# Patient Record
Sex: Female | Born: 1975 | Race: White | Hispanic: No | Marital: Married | Smoking: Former smoker
Health system: Southern US, Community
[De-identification: ages and names within clinical notes are randomized; demographics above are authoritative.]

## PROBLEM LIST (undated history)

## (undated) HISTORY — PX: WISDOM TOOTH EXTRACTION: SHX21

## (undated) HISTORY — PX: ADENOIDECTOMY: SUR15

---

## 2012-10-15 ENCOUNTER — Encounter: Payer: Self-pay | Admitting: Emergency Medicine

## 2012-10-15 ENCOUNTER — Emergency Department
Admission: EM | Admit: 2012-10-15 | Discharge: 2012-10-15 | Disposition: A | Discharge status: Home / Self Care | Payer: BC Managed Care – PPO | Source: Home / Self Care | Attending: Emergency Medicine | Admitting: Emergency Medicine

## 2012-10-15 DIAGNOSIS — H109 Unspecified conjunctivitis: Secondary | ICD-10-CM

## 2012-10-15 DIAGNOSIS — J322 Chronic ethmoidal sinusitis: Secondary | ICD-10-CM

## 2012-10-15 MED ORDER — TOBRAMYCIN 0.3 % OP SOLN: 1.0000 [drp] | OPHTHALMIC | Status: DC

## 2012-10-15 MED ORDER — CEPHALEXIN 500 MG PO CAPS: 500.0000 mg | ORAL_CAPSULE | Freq: Three times a day (TID) | ORAL | Status: DC

## 2012-10-15 NOTE — ED Provider Notes (Signed)
CSN: [id number]     Arrival date & time 10/15/12  4401 History   First MD Initiated Contact with Patient 10/15/12 1001     Chief Complaint  Patient presents with  . Sinus Problem  . Eye Pain    Patient is a 37 y.o. female presenting with eye pain. The history is provided by the patient.  Eye Pain  SINUSITIS  Onset: 7 days Facial/sinus pressure with discolored nasal mucus.    Severity: moderate Tried OTC meds without significant relief. Then, 2 days ago onset both eyes feel swollen irritated with yellow drainage, right worse than left.--She has multiple drug allergies, but does use tobramycin for conjunctivitis in the past without problems. For sinus infections in the past, she has taken cephalexin without any problems.  Symptoms:  + Fever  + URI prodrome with nasal congestion + Minimal swollen neck glands + mild Sinus Headache + mild ear pressure  No Allergy symptoms No significant Sore Throat      No significant Cough No chest pain No shortness of breath  No wheezing  No Abdominal Pain No Nausea No Vomiting No diarrhea  No Myalgias No focal neurologic symptoms No syncope No Rash  No Urinary symptoms          History reviewed. No pertinent past medical history. History reviewed. No pertinent past surgical history. Family History  Problem Relation Age of Onset  . Cancer Mother   . Diabetes Father   . Hypertension Father   . Cancer Father    History  Substance Use Topics  . Smoking status: Never Smoker   . Smokeless tobacco: Not on file  . Alcohol Use: No   OB History   Grav Para Term Preterm Abortions TAB SAB Ect Mult Living                 Review of Systems  Eyes: Positive for pain.  All other systems reviewed and are negative.    Allergies  Amoxicillin; Biaxin; Erythromycin; Sulfa antibiotics; and Tetracyclines & related  Home Medications   Current Outpatient Rx  Name  Route  Sig  Dispense  Refill  . cephALEXin (KEFLEX) 500 MG  capsule   Oral   Take 1 capsule (500 mg total) by mouth 3 (three) times daily.   30 capsule   0   . tobramycin (TOBREX) 0.3 % ophthalmic solution   Both Eyes   Place 1 drop into both eyes every 4 (four) hours.   5 mL   0    BP 124/89  Pulse 78  Temp(Src) 98.1 F (36.7 C) (Oral)  Ht 5\' 9"  (1.753 m)  Wt 229 lb (103.874 kg)  BMI 33.8 kg/m2  SpO2 99% Physical Exam  Nursing note and vitals reviewed. Constitutional: She is oriented to person, place, and time. She appears well-developed and well-nourished. No distress.  HENT:  Head: Normocephalic and atraumatic.  Right Ear: Tympanic membrane, external ear and ear canal normal.  Left Ear: Tympanic membrane, external ear and ear canal normal.  Nose: Mucosal edema and rhinorrhea present. Right sinus exhibits maxillary sinus tenderness. Left sinus exhibits maxillary sinus tenderness.  Mouth/Throat: Oropharynx is clear and moist. No oral lesions. No oropharyngeal exudate.  Eyes: Right eye exhibits discharge (mild crusty discharge). Left eye exhibits no discharge. No scleral icterus.  Both conjunctiva are red and inflamed and injected, right greater than left. No foreign body seen. No abrasion seen.  Neck: Neck supple.  Cardiovascular: Normal rate, regular rhythm and normal heart sounds.  Pulmonary/Chest: Effort normal and breath sounds normal. She has no wheezes. She has no rales.  Lymphadenopathy:    She has no cervical adenopathy.  Neurological: She is alert and oriented to person, place, and time.  Skin: Skin is warm and dry.    ED Course  Procedures (including critical care time) Labs Review Labs Reviewed - No data to display Imaging Review No results found.  MDM   1. Conjunctivitis   2. Ethmoid sinusitis    Treatment options discussed. Tobramycin eyedrops. Discard current contact lenses, and advised not to restart contact lenses until completely better. Followup with ophthalmologist if eye symptoms persist . For  the sinusitis, cephalexin prescribed. ( she has taken cephalexin in the past without any problems.) Other symptomatic care discussed. Precautions discussed. Red flags discussed. Questions invited and answered. Patient voiced understanding and agreement.    Lajean Manes, MD 10/15/12 [phone]

## 2012-10-15 NOTE — ED Notes (Addendum)
Sinus pressure and pain x 1 week Rt eye, swollen, burns, painful, draining x 2 days

## 2013-03-26 ENCOUNTER — Emergency Department
Admission: EM | Admit: 2013-03-26 | Discharge: 2013-03-26 | Disposition: A | Discharge status: Home / Self Care | Payer: BC Managed Care – PPO | Source: Home / Self Care | Attending: Family Medicine | Admitting: Family Medicine

## 2013-03-26 ENCOUNTER — Encounter: Payer: Self-pay | Admitting: Emergency Medicine

## 2013-03-26 DIAGNOSIS — R509 Fever, unspecified: Secondary | ICD-10-CM

## 2013-03-26 DIAGNOSIS — J111 Influenza due to unidentified influenza virus with other respiratory manifestations: Secondary | ICD-10-CM

## 2013-03-26 DIAGNOSIS — R69 Illness, unspecified: Principal | ICD-10-CM

## 2013-03-26 DIAGNOSIS — R52 Pain, unspecified: Secondary | ICD-10-CM

## 2013-03-26 MED ORDER — BENZONATATE 200 MG PO CAPS: 200.0000 mg | ORAL_CAPSULE | Freq: Every day | ORAL | Status: DC

## 2013-03-26 MED ORDER — OSELTAMIVIR PHOSPHATE 75 MG PO CAPS: 75.0000 mg | ORAL_CAPSULE | Freq: Two times a day (BID) | ORAL | Status: DC

## 2013-03-26 NOTE — Discharge Instructions (Signed)
Take plain Mucinex (1200 mg guaifenesin) twice daily for cough and congestion.  May add Sudafed for sinus congestion.   Increase fluid intake, rest. °May use Afrin nasal spray (or generic oxymetazoline) twice daily for about 5 days.  Also recommend using saline nasal spray several times daily and saline nasal irrigation (AYR is a common brand) °Try warm salt water gargles for sore throat.  °Stop all antihistamines for now, and other non-prescription cough/cold preparations. °May take Ibuprofen 200mg, 4 tabs every 8 hours with food for fever, body aches, etc. °  °   °

## 2013-03-26 NOTE — ED Provider Notes (Signed)
CSN: [id number]     Arrival date & time 03/26/13  1050 History   First MD Initiated Contact with Patient 03/26/13 1112     Chief Complaint  Patient presents with  . Fever  . Generalized Body Aches      HPI Comments: 48 hours ago patient noticed swelling in her left neck, followed by right neck swelling and soreness yesterday, although she has no soreness when swallowing.  Yesterday she developed fatigue, myalgias, productive cough, but no sinus congestion.  The history is provided by the patient and the spouse.    History reviewed. No pertinent past medical history. Past Surgical History  Procedure Laterality Date  . Adenoidectomy     Family History  Problem Relation Age of Onset  . Cancer Mother   . Diabetes Father   . Hypertension Father   . Cancer Father    History  Substance Use Topics  . Smoking status: Never Smoker   . Smokeless tobacco: Never Used  . Alcohol Use: No   OB History   Grav Para Term Preterm Abortions TAB SAB Ect Mult Living                 Review of Systems + sore throat (when coughing) + cough + hoarseness No pleuritic pain No wheezing No nasal congestion No post-nasal drainage No sinus pain/pressure No itchy/red eyes No earache No hemoptysis No SOB + fever, + chills No nausea No vomiting No abdominal pain No diarrhea No urinary symptoms No skin rash + fatigue + myalgias + headache Used OTC meds without relief  Allergies  Amoxicillin; Biaxin; Erythromycin; Sulfa antibiotics; and Tetracyclines & related  Home Medications   Current Outpatient Rx  Name  Route  Sig  Dispense  Refill  . benzonatate (TESSALON) 200 MG capsule   Oral   Take 1 capsule (200 mg total) by mouth at bedtime. Take as needed for cough   12 capsule   0   . oseltamivir (TAMIFLU) 75 MG capsule   Oral   Take 1 capsule (75 mg total) by mouth every 12 (twelve) hours.   10 capsule   0    BP 120/85  Pulse 104  Temp(Src) 99.9 F (37.7 C) (Oral)  Resp 14   Wt 224 lb (101.606 kg)  SpO2 100%  LMP 02/26/2013 Physical Exam Nursing notes and Vital Signs reviewed. Appearance:  Patient appears stated age, and in no acute distress.  Patient is obese. Eyes:  Pupils are equal, round, and reactive to light and accomodation.  Extraocular movement is intact.  Conjunctivae are not inflamed  Ears:  Canals normal.  Tympanic membranes normal.  Nose:  Mildly congested turbinates.  No sinus tenderness.   Pharynx:  Normal Neck:  Supple.  Tender enlarged posterior nodes are palpated bilaterally  Lungs:  Clear to auscultation.  Breath sounds are equal.  Heart:  Regular rate and rhythm without murmurs, rubs, or gallops.  Abdomen:  Nontender without masses or hepatosplenomegaly.  Bowel sounds are present.  No CVA or flank tenderness.  Extremities:  No edema.  No calf tenderness Skin:  No rash present.   ED Course  Procedures  none      MDM   1. Influenza-like illness    Begin Tamiflu.  Prescription written for Benzonatate [facility]) to take at bedtime for night-time cough.  Take plain Mucinex (1200 mg guaifenesin) twice daily for cough and congestion.  May add Sudafed for sinus congestion.   Increase fluid intake, rest. May use Afrin  nasal spray (or generic oxymetazoline) twice daily for about 5 days.  Also recommend using saline nasal spray several times daily and saline nasal irrigation (AYR is a common brand) Try warm salt water gargles for sore throat.  Stop all antihistamines for now, and other non-prescription cough/cold preparations. May take Ibuprofen 200mg , 4 tabs every 8 hours with food for fever, body aches, etc. Followup with Family Doctor if not improved in one week.     Lattie HawStephen A [place] Boccio, MD 03/26/13 1141

## 2013-03-26 NOTE — ED Notes (Signed)
Alexandria DikeJennifer c/o rapid onset body aches, fever, t-max 102 and productive cough x yesterday. Rec'd flu vac this season.

## 2013-06-06 ENCOUNTER — Encounter: Payer: Self-pay | Admitting: Emergency Medicine

## 2013-06-06 ENCOUNTER — Emergency Department
Admission: EM | Admit: 2013-06-06 | Discharge: 2013-06-06 | Disposition: A | Discharge status: Home / Self Care | Payer: BC Managed Care – PPO | Source: Home / Self Care | Attending: Emergency Medicine | Admitting: Emergency Medicine

## 2013-06-06 DIAGNOSIS — L237 Allergic contact dermatitis due to plants, except food: Secondary | ICD-10-CM

## 2013-06-06 DIAGNOSIS — H01119 Allergic dermatitis of unspecified eye, unspecified eyelid: Secondary | ICD-10-CM

## 2013-06-06 DIAGNOSIS — L255 Unspecified contact dermatitis due to plants, except food: Secondary | ICD-10-CM

## 2013-06-06 MED ORDER — PREDNISONE (PAK) 10 MG PO TABS: ORAL_TABLET | ORAL | Status: DC

## 2013-06-06 MED ORDER — METHYLPREDNISOLONE ACETATE 80 MG/ML IJ SUSP
80.0000 mg | Freq: Once | INTRAMUSCULAR | Status: AC
Start: 2013-06-06 — End: 2013-06-06
  Administered 2013-06-06: 80 mg via INTRAMUSCULAR

## 2013-06-06 NOTE — ED Provider Notes (Signed)
CSN: [id number]     Arrival date & time 06/06/13  [id number] History   First MD Initiated Contact with Patient 06/06/13 1000     Chief Complaint  Patient presents with  . Rash    HPI Victorino DikeJennifer complains of a progressively worsening, pruritic rash on right side of face for 2 days. Affecting right eyelid. She reports having had some redness and swelling on her right eye lid with some eye pain. She was seen by an Ophthalmologist who did report some eye swelling.--Slit lamp exam is otherwise negative. She then saw an Optometrist and was given Tobradex eye ointment, which helped a little bit. Denies any eye discharge. Then yesterday, noticed a progressively worsening itchy rash on her cheeks then on her neck, and a few areas on trunk and extremity. The area is very itchy and is burning. No chest pain or shortness of breath or nausea or vomiting or fever. She feels this may have started when doing yard work and mowing the lawn a few days ago. Denies any discolored rhinorrhea or sore throat or earache or cough or wheezing. The itch is severe. Denies any tick bite  History reviewed. No pertinent past medical history. Past Surgical History  Procedure Laterality Date  . Adenoidectomy     Family History  Problem Relation Age of Onset  . Cancer Mother   . Diabetes Father   . Hypertension Father   . Cancer Father    History  Substance Use Topics  . Smoking status: Never Smoker   . Smokeless tobacco: Never Used  . Alcohol Use: No   OB History   Grav Para Term Preterm Abortions TAB SAB Ect Mult Living                 Review of Systems  All other systems reviewed and are negative.   Allergies  Amoxicillin; Biaxin; Erythromycin; Sulfa antibiotics; and Tetracyclines & related  Home Medications   Prior to Admission medications   Medication Sig Start Date End Date Taking? Authorizing Provider  predniSONE (STERAPRED UNI-PAK) 10 MG tablet Take as directed for 6 days.--Take 6 on day 1, 5 on day  2, 4 on day 3, then 3 tablets on day 4, then 2 tablets on day 5, then 1 on day 6. 06/06/13   Lajean Manesavid Massey, MD   BP 119/80  Pulse 73  Temp(Src) 98.3 F (36.8 C) (Oral)  Resp 17  Ht 5\' 8"  (1.727 m)  Wt 225 lb (102.059 kg)  BMI 34.22 kg/m2  SpO2 100% Physical Exam  Nursing note and vitals reviewed. Constitutional: She is oriented to person, place, and time. She appears well-developed and well-nourished. No distress.  Alert, pleasant, cooperative female. No acute cardiorespiratory distress. She appears very uncomfortable from pruritic rash, which is prominent right eyelid and right face  HENT:  Head: Normocephalic and atraumatic.  Mouth/Throat: Oropharynx is clear and moist.  No lip swelling. Oropharyngeal Airway intact  Nose: Minimal serous drainage otherwise negative. Ears: Normal. TMs normal  Eyes: Conjunctivae and EOM are normal. Pupils are equal, round, and reactive to light. Right eye exhibits no discharge. Left eye exhibits no discharge. No scleral icterus.  Visual acuity grossly intact bilaterally  Neck: Normal range of motion. Neck supple. No JVD present. No tracheal deviation present.  Cardiovascular: Normal rate, regular rhythm and normal heart sounds.   No murmur heard. Pulmonary/Chest: Effort normal and breath sounds normal.  Abdominal: She exhibits no distension.  Musculoskeletal: Normal range of motion.  No joint swelling  or redness  Lymphadenopathy:    She has no cervical adenopathy.  Neurological: She is alert and oriented to person, place, and time. No cranial nerve deficit.  Skin: Skin is warm. Rash noted. Rash is maculopapular and urticarial.     As depicted, red maculopapular inflamed rash, few vesicles, in streaks in clusters right eyelid, right facial cheek, right neck, and scattered similar lesions on trunk and extremities. All of these are NOT in a dermatomal distribution  Psychiatric: She has a normal mood and affect.    ED Course  Procedures (including  critical care time) Labs Review Labs Reviewed - No data to display  Imaging Review No results found.   MDM   1. Poison ivy dermatitis   2. Allergic blepharitis    Treatment options discussed, as well as risks, benefits, alternatives. Patient voiced understanding and agreement with the following plans: Because symptoms are severe and progressively worsening, she agrees with treating aggressively: Depo-Medrol 80 mg IM stat. Prednisone 10 mg-6 day Dosepak Other symptomatic and nonpharmacologic measures discussed. She declined prescription for antihistamine, but may use Zyrtec or Benadryl by mouth as needed for itch, precautions discussed Follow-up with your PCP or dermatologist in 2-3 days if not improving, or sooner if symptoms become worse. Precautions discussed. Red flags discussed.---Go to ER if any red flags. Questions invited and answered. Patient voiced understanding and agreement.    Lajean Manesavid Massey, MD 06/06/13 1049

## 2013-06-06 NOTE — ED Notes (Signed)
Alexandria DikeJennifer complains of a rash on her face for 2 days. She reports having had some redness and swelling on her right eye lid with some eye pain. She was seen by an Ophthalmologist who did report some eye swelling. She then saw an Optometrist and was given Tobradex eye drops. She then noticed a rash on her cheeks then on her neck. The area is very itchy and is burning.

## 2013-10-22 ENCOUNTER — Encounter: Payer: Self-pay | Admitting: Emergency Medicine

## 2013-10-22 ENCOUNTER — Emergency Department (INDEPENDENT_AMBULATORY_CARE_PROVIDER_SITE_OTHER)
Admission: EM | Admit: 2013-10-22 | Discharge: 2013-10-22 | Disposition: A | Discharge status: Home / Self Care | Payer: BC Managed Care – PPO | Source: Home / Self Care

## 2013-10-22 DIAGNOSIS — Z23 Encounter for immunization: Secondary | ICD-10-CM

## 2013-10-22 MED ORDER — INFLUENZA VAC SPLIT QUAD 0.5 ML IM SUSP: 0.5000 mL | Freq: Once | INTRAMUSCULAR | Status: DC

## 2013-10-22 NOTE — ED Notes (Signed)
The pt is here today for an influenza vaccine.   

## 2014-03-26 ENCOUNTER — Emergency Department
Admission: EM | Admit: 2014-03-26 | Discharge: 2014-03-26 | Disposition: A | Discharge status: Home / Self Care | Payer: BLUE CROSS/BLUE SHIELD | Source: Home / Self Care | Attending: Family Medicine | Admitting: Family Medicine

## 2014-03-26 ENCOUNTER — Encounter: Payer: Self-pay | Admitting: *Deleted

## 2014-03-26 DIAGNOSIS — J029 Acute pharyngitis, unspecified: Secondary | ICD-10-CM

## 2014-03-26 LAB — POCT RAPID STREP A (OFFICE): Rapid Strep A Screen: NEGATIVE

## 2014-03-26 MED ORDER — BENZONATATE 200 MG PO CAPS: 200.0000 mg | ORAL_CAPSULE | Freq: Every day | ORAL | Status: DC

## 2014-03-26 MED ORDER — CEPHALEXIN 500 MG PO CAPS: 500.0000 mg | ORAL_CAPSULE | Freq: Three times a day (TID) | ORAL | Status: DC

## 2014-03-26 NOTE — ED Notes (Signed)
Pt c/o sore throat and swollen LT tonsil x 1 day. Denies fever.

## 2014-03-26 NOTE — Discharge Instructions (Signed)
Take plain guaifenesin (600mg  or1200mg  extended release tabs such as Mucinex) twice daily, with plenty of water, for cough and congestion.  May add Pseudoephedrine for sinus congestion.  Get adequate rest.   May use Afrin nasal spray (or generic oxymetazoline) twice daily for about 5 days.  Also recommend using saline nasal spray several times daily and saline nasal irrigation (AYR is a common brand).  Try warm salt water gargles for sore throat.  Stop all antihistamines for now, and other non-prescription cough/cold preparations. May take Ibuprofen 200mg , 4 tabs every 8 hours with food for body aches, headache, etc. Begin Cephalexin if not improving about one week or if persistent fever develops  Follow-up with family doctor if not improving about10 days.

## 2014-03-26 NOTE — ED Provider Notes (Signed)
CSN: [id number]     Arrival date & time 03/26/14  1600 History   First MD Initiated Contact with Patient 03/26/14 1651     Chief Complaint  Patient presents with  . Sore Throat     HPI Comments: Patient complains of five day history of typical cold-like symptoms including mild sore throat, sinus congestion, headache, fatigue, and mild cough.   The history is provided by the patient.    History reviewed. No pertinent past medical history. Past Surgical History  Procedure Laterality Date  . Adenoidectomy    . Wisdom tooth extraction     Family History  Problem Relation Age of Onset  . Cancer Mother   . Diabetes Father   . Hypertension Father   . Cancer Father    History  Substance Use Topics  . Smoking status: Former Games developermoker  . Smokeless tobacco: Never Used  . Alcohol Use: No   OB History    No data available     Review of Systems + sore throat + minimal cough No pleuritic pain No wheezing + mild nasal congestion ? post-nasal drainage No sinus pain/pressure No itchy/red eyes + left earache No hemoptysis No SOB No fever/chills No nausea No vomiting No abdominal pain No diarrhea No urinary symptoms No skin rash + fatigue + myalgias + headache Used OTC meds without relief  Allergies  Amoxicillin; Biaxin; Erythromycin; Penicillins; Sulfa antibiotics; and Tetracyclines & related  Home Medications   Prior to Admission medications   Medication Sig Start Date End Date Taking? Authorizing Provider  benzonatate (TESSALON) 200 MG capsule Take 1 capsule (200 mg total) by mouth at bedtime. Take as needed for cough 03/26/14   Lattie HawStephen A Loida Calamia, MD  cephALEXin (KEFLEX) 500 MG capsule Take 1 capsule (500 mg total) by mouth 3 (three) times daily. (Rx void after 04/02/14) 03/26/14   Lattie HawStephen A Sal Spratley, MD   BP 124/84 mmHg  Pulse 96  Temp(Src) 98.1 F (36.7 C) (Oral)  Resp 18  Ht 5\' 8"  (1.727 m)  Wt 232 lb (105.235 kg)  BMI 35.28 kg/m2  SpO2 100%  LMP 03/12/2014 Physical  Exam Nursing notes and Vital Signs reviewed. Appearance:  Patient appears stated age, and in no acute distress.  Patient is obese (BMI 35.3) Eyes:  Pupils are equal, round, and reactive to light and accomodation.  Extraocular movement is intact.  Conjunctivae are not inflamed  Ears:  Canals normal.  Tympanic membranes normal.  Nose:  Mildly congested turbinates.  No sinus tenderness.    Pharynx:  Minimal erythema Neck:  Supple.   Tender enlarged posterior nodes are palpated bilaterally  Lungs:  Clear to auscultation.  Breath sounds are equal.  Heart:  Regular rate and rhythm without murmurs, rubs, or gallops.  Abdomen:  Nontender without masses or hepatosplenomegaly.  Bowel sounds are present.  No CVA or flank tenderness.  Extremities:  No edema.  No calf tenderness Skin:  No rash present.   ED Course  Procedures  None    Labs Reviewed  POCT RAPID STREP A (OFFICE) negative         MDM  DIAGNOSIS:  PHARYNGITIS; SUSPECT EARLY VIRAL URI  There is no evidence of bacterial infection today.   Throat culture pending.  Prescription written for Benzonatate [facility](Tessalon) to take at bedtime for night-time cough.  Take plain guaifenesin (600mg  or1200mg  extended release tabs such as Mucinex) twice daily, with plenty of water, for cough and congestion.  May add Pseudoephedrine for sinus congestion.  Get  adequate rest.   May use Afrin nasal spray (or generic oxymetazoline) twice daily for about 5 days.  Also recommend using saline nasal spray several times daily and saline nasal irrigation (AYR is a common brand).  Try warm salt water gargles for sore throat.  Stop all antihistamines for now, and other non-prescription cough/cold preparations. May take Ibuprofen , 4 tabs every 8 hours with food for body aches, headache, etc. Begin Cephalexin if not improving about one week or if persistent fever develops (Given a prescription to hold, with an expiration date)  Follow-up with family doctor if  not improving about10 days.    Lattie Haw, MD 03/28/14 [phone]

## 2014-03-27 LAB — STREP A DNA PROBE: GASP: NEGATIVE

## 2014-03-28 ENCOUNTER — Telehealth: Payer: Self-pay | Admitting: Emergency Medicine

## 2014-05-21 ENCOUNTER — Emergency Department
Admission: EM | Admit: 2014-05-21 | Discharge: 2014-05-21 | Disposition: A | Discharge status: Home / Self Care | Payer: BLUE CROSS/BLUE SHIELD | Source: Home / Self Care | Attending: Family Medicine | Admitting: Family Medicine

## 2014-05-21 ENCOUNTER — Encounter: Payer: Self-pay | Admitting: *Deleted

## 2014-05-21 DIAGNOSIS — R3589 Other polyuria: Secondary | ICD-10-CM

## 2014-05-21 DIAGNOSIS — H01006 Unspecified blepharitis left eye, unspecified eyelid: Secondary | ICD-10-CM

## 2014-05-21 DIAGNOSIS — R358 Other polyuria: Secondary | ICD-10-CM

## 2014-05-21 DIAGNOSIS — H01003 Unspecified blepharitis right eye, unspecified eyelid: Secondary | ICD-10-CM | POA: Diagnosis not present

## 2014-05-21 LAB — POCT URINALYSIS DIP (MANUAL ENTRY)
Bilirubin, UA: NEGATIVE
Glucose, UA: NEGATIVE
Ketones, POC UA: NEGATIVE
NITRITE UA: NEGATIVE
PROTEIN UA: NEGATIVE
Spec Grav, UA: 1.025 (ref 1.005–1.03)
UROBILINOGEN UA: 0.2 (ref 0–1)
pH, UA: 6 (ref 5–8)

## 2014-05-21 MED ORDER — NITROFURANTOIN MONOHYD MACRO 100 MG PO CAPS: 100.0000 mg | ORAL_CAPSULE | Freq: Two times a day (BID) | ORAL | Status: DC

## 2014-05-21 MED ORDER — TOBRAMYCIN 0.3 % OP SOLN: 1.0000 [drp] | OPHTHALMIC | Status: DC

## 2014-05-21 NOTE — Discharge Instructions (Signed)
Increase fluid intake.   Blepharitis Blepharitis is redness, soreness, and swelling (inflammation) of one or both eyelids. It may be caused by an allergic reaction or a bacterial infection. Blepharitis may also be associated with reddened, scaly skin (seborrhea) of the scalp and eyebrows. While you sleep, eye discharge may cause your eyelashes to stick together. Your eyelids may itch, burn, swell, and may lose their lashes. These will grow back. Your eyes may become sensitive. Blepharitis may recur and need repeated treatment. If this is the case, you may require further evaluation by an eye specialist (ophthalmologist). HOME CARE INSTRUCTIONS   Keep your hands clean.  Use a clean towel each time you dry your eyelids. Do not use this towel to clean other areas. Do not share a towel or makeup with anyone.  Wash your eyelids with warm water or warm water mixed with a small amount of baby shampoo. Do this twice a day or as often as needed.  Wash your face and eyebrows at least once a day.  Use warm compresses 2 times a day for 10 minutes at a time, or as directed by your caregiver.  Apply antibiotic ointment as directed by your caregiver.  Avoid rubbing your eyes.  Avoid wearing makeup until you get better.  Follow up with your caregiver as directed. SEEK IMMEDIATE MEDICAL CARE IF:   You have pain, redness, or swelling that gets worse or spreads to other parts of your face.  Your vision changes, or you have pain when looking at lights or moving objects.  You have a fever.  Your symptoms continue for longer than 2 to 4 days or become worse. MAKE SURE YOU:   Understand these instructions.  Will watch your condition.  Will get help right away if you are not doing well or get worse. Document Released: 01/08/2000 Document Revised: 04/04/2011 Document Reviewed: 02/17/2010 [facility] Patient Information 2015 [employer] [place]. This information is not intended to replace advice given to you  by your health care provider. Make sure you discuss any questions you have with your health care provider.

## 2014-05-21 NOTE — ED Provider Notes (Signed)
CSN: [id number]     Arrival date & time 05/21/14  8295 History   First MD Initiated Contact with Patient 05/21/14 1007     Chief Complaint  Patient presents with  . Eye Drainage  . Polyuria      HPI Comments: Patient presents with two complaints: 1)  She has had bilateral eye drainage and pain for about four days.  Her discomfort is localized in her upper lids.  She states that she also has a history of chronic dry eyes that has not responded to OTC lubricating drops 2)  She complains of two day history of flank pain and urinary frequency.  No abdominal pain.  No fevers, chills, and sweats   Patient is a 39 y.o. female presenting with frequency and eye problem. The history is provided by the patient.  Urinary Frequency This is a new problem. The current episode started 2 days ago. The problem occurs constantly. The problem has been rapidly worsening. Pertinent negatives include no abdominal pain and no headaches. Nothing aggravates the symptoms. Nothing relieves the symptoms. She has tried nothing for the symptoms.  Eye Problem Location:  Both Quality:  Aching Severity:  Mild Onset quality:  Gradual Duration:  4 days Timing:  Constant Progression:  Worsening Chronicity:  New Context comment:  Dry eyes Relieved by:  Nothing Worsened by:  Eye movement Ineffective treatments:  Eye drops Associated symptoms: crusting, inflammation, redness, swelling and tearing   Associated symptoms: no blurred vision, no decreased vision, no discharge, no double vision, no facial rash, no foreign body sensation, no headaches, no itching, no nausea and no photophobia     History reviewed. No pertinent past medical history. Past Surgical History  Procedure Laterality Date  . Adenoidectomy    . Wisdom tooth extraction     Family History  Problem Relation Age of Onset  . Cancer Mother   . Diabetes Father   . Hypertension Father   . Cancer Father    History  Substance Use Topics  . Smoking  status: Former Games developer  . Smokeless tobacco: Never Used  . Alcohol Use: No   OB History    No data available     Review of Systems  Eyes: Positive for redness. Negative for blurred vision, double vision, photophobia, discharge and itching.  Gastrointestinal: Negative for nausea and abdominal pain.  Genitourinary: Positive for frequency.  Neurological: Negative for headaches.  All other systems reviewed and are negative.   Allergies  Amoxicillin; Biaxin; Erythromycin; Penicillins; Sulfa antibiotics; and Tetracyclines & related  Home Medications   Prior to Admission medications   Medication Sig Start Date End Date Taking? Authorizing Provider  nitrofurantoin, macrocrystal-monohydrate, (MACROBID) 100 MG capsule Take 1 capsule (100 mg total) by mouth 2 (two) times daily. Take with food. 05/21/14   Lattie Haw, MD  tobramycin (TOBREX) 0.3 % ophthalmic solution Place 1 drop into both eyes every 4 (four) hours. 05/21/14   Lattie Haw, MD   BP 117/80 mmHg  Pulse 81  Temp(Src) 98.3 F (36.8 C) (Oral)  Resp 16  Wt 234 lb (106.142 kg)  SpO2 98%  LMP 05/10/2014 Physical Exam  Constitutional: She is oriented to person, place, and time. She appears well-developed and well-nourished. No distress.  Patient is obese  HENT:  Head: Normocephalic.  Nose: Nose normal.  Mouth/Throat: Oropharynx is clear and moist.  Eyes: Conjunctivae and EOM are normal. Pupils are equal, round, and reactive to light. Right eye exhibits no chemosis and no discharge.  Left eye exhibits no chemosis and no discharge.    Mild tenderness and erythema eyelid margins bilaterally.  No swelling.  Neck: Neck supple.  Cardiovascular: Normal heart sounds.   Pulmonary/Chest: Breath sounds normal.  Abdominal: There is no tenderness.  No CVA or flank tenderness  Lymphadenopathy:    She has no cervical adenopathy.  Neurological: She is alert and oriented to person, place, and time.  Skin: Skin is warm and dry. No  rash noted.  Nursing note and vitals reviewed.   ED Course  Procedures  None   Labs Reviewed  URINE CULTURE  POCT URINALYSIS DIP (MANUAL ENTRY):  BLO small; LEU trace; otherwise negative      MDM   1. Polyuria   2. Blepharitis of both eyes    Begin Tobramycin ophthalmic suspension. Begin warm compresses  Begin Macrobid 100mg  BID.  Urine culture pending.  Increase fluid intake. Followup with ophthalmologist for evaluation of dry eyes.    Lattie HawStephen A Beese, MD 05/23/14 [phone]

## 2014-05-21 NOTE — ED Notes (Signed)
Pt c/o 4 days of bilateral eye drainage, redness and pain. Around the same time developed tenderness under left arm. She also c/o 2 days of flank pain and polyuria.

## 2014-05-23 LAB — URINE CULTURE
Colony Count: NO GROWTH
ORGANISM ID, BACTERIA: NO GROWTH

## 2014-05-24 ENCOUNTER — Telehealth: Payer: Self-pay | Admitting: *Deleted

## 2014-07-02 ENCOUNTER — Emergency Department
Admission: EM | Admit: 2014-07-02 | Discharge: 2014-07-02 | Disposition: A | Discharge status: Home / Self Care | Payer: BLUE CROSS/BLUE SHIELD | Source: Home / Self Care | Attending: Emergency Medicine | Admitting: Emergency Medicine

## 2014-07-02 ENCOUNTER — Encounter: Payer: Self-pay | Admitting: *Deleted

## 2014-07-02 ENCOUNTER — Emergency Department (INDEPENDENT_AMBULATORY_CARE_PROVIDER_SITE_OTHER): Payer: BLUE CROSS/BLUE SHIELD

## 2014-07-02 DIAGNOSIS — M79661 Pain in right lower leg: Secondary | ICD-10-CM | POA: Diagnosis not present

## 2014-07-02 DIAGNOSIS — S86811A Strain of other muscle(s) and tendon(s) at lower leg level, right leg, initial encounter: Secondary | ICD-10-CM

## 2014-07-02 MED ORDER — HYDROCODONE-ACETAMINOPHEN 5-325 MG PO TABS: 2.0000 | ORAL_TABLET | ORAL | Status: DC | PRN

## 2014-07-02 MED ORDER — IBUPROFEN 800 MG PO TABS: 800.0000 mg | ORAL_TABLET | Freq: Three times a day (TID) | ORAL | Status: DC

## 2014-07-02 NOTE — ED Notes (Signed)
Alexandria Barrera was stepping backwards down a hill today and heard a "pop" and felt sensation of being hit in the right calf with a baseball. Pain 10/10 with activity.

## 2014-07-02 NOTE — Discharge Instructions (Signed)
Muscle Tear °A muscle tear is usually caused by over-exertion or stretching. The muscle often takes a while to heal. Muscle tears require 3 to 4 weeks to heal completely. A history of the injury and a physical exam may be performed. Sometimes, the injury is identified with radiographs and an MRI. Treatment for muscle injuries includes: °· Resting and protecting the affected area until pain with motion is gone. °· Putting ice on the injured area. °¨ Put ice in a bag. °¨ Place a towel between your skin and the bag. °¨ Leave the ice on for 15 to 20 minutes, 3 to 4 times a day. °¨ After two days, you can use heat to relieve spasms. °· Using compression wraps to help control swelling and limit movement. °· Raising (elevate) the injured area above the level of the heart (if possible) for the first 1 to 2 days after the injury. °· Medicine may be prescribed to reduce pain and inflammation. °Avoid strenuous activities that bring on muscle pain. Exercises to strengthen and stretch the injured muscle can help heal the muscle and prevent repeated injury. After the pain and swelling are gone, you may begin gradual strengthening exercises. Begin range-of-motion exercises and gentle stretching after 3 to 4 days of rest.  °SEEK MEDICAL CARE IF:  °Your injured muscle is not improving after 1 week of treatment. °Document Released: 02/18/2004 Document Revised: 04/04/2011 Document Reviewed: 07/25/2008 °[employer]® Patient Information ©2015 [employer]. This information is not intended to replace advice given to you by your health care provider. Make sure you discuss any questions you have with your health care provider. ° °

## 2014-07-02 NOTE — ED Provider Notes (Signed)
CSN: [id number]     Arrival date & time 07/02/14  1638 History   First MD Initiated Contact with Patient 07/02/14 1707     Chief Complaint  Patient presents with  . Leg Pain   (Consider location/radiation/quality/duration/timing/severity/associated sxs/prior Treatment) Patient is a 39 y.o. female presenting with leg pain. The history is provided by the patient. No language interpreter was used.  Leg Pain Location:  Leg Injury: yes   Mechanism of injury: fall   Fall:    Fall occurred:  Walking   Height of fall:  Standing   Point of impact:  Unable to specify   Entrapped after fall: no   Leg location:  L leg Pain details:    Quality:  Aching and shooting   Radiates to:  Does not radiate   Severity:  Moderate   Onset quality:  Gradual   Timing:  Constant   Progression:  Worsening Chronicity:  New Dislocation: no   Foreign body present:  No foreign bodies Relieved by:  Nothing Ineffective treatments:  None tried Pt fell backwards and heard a loud pop in her calf area,   Pt complains of swelling and pain  History reviewed. No pertinent past medical history. Past Surgical History  Procedure Laterality Date  . Adenoidectomy    . Wisdom tooth extraction     Family History  Problem Relation Age of Onset  . Cancer Mother   . Diabetes Father   . Hypertension Father   . Cancer Father    History  Substance Use Topics  . Smoking status: Former Games developer  . Smokeless tobacco: Never Used  . Alcohol Use: No   OB History    No data available     Review of Systems  Musculoskeletal: Positive for myalgias.  All other systems reviewed and are negative.   Allergies  Amoxicillin; Biaxin; Erythromycin; Penicillins; Sulfa antibiotics; and Tetracyclines & related  Home Medications   Prior to Admission medications   Medication Sig Start Date End Date Taking? Authorizing Provider  tobramycin (TOBREX) 0.3 % ophthalmic solution Place 1 drop into both eyes every 4 (four) hours.  05/21/14   Lattie Haw, MD   BP 135/82 mmHg  Pulse 99  Resp 16  SpO2 99%  LMP 06/04/2014 (Approximate) Physical Exam  Constitutional: She is oriented to person, place, and time. She appears well-developed and well-nourished.  HENT:  Head: Normocephalic.  Eyes: EOM are normal.  Neck: Normal range of motion.  Pulmonary/Chest: Effort normal.  Abdominal: She exhibits no distension.  Musculoskeletal: She exhibits tenderness.  Tender back of calf muscle mid calf, swollen  nv and ns intact,  No injury to knee or ankle  Neurological: She is alert and oriented to person, place, and time.  Psychiatric: She has a normal mood and affect.  Nursing note and vitals reviewed.   ED Course  Procedures (including critical care time) Labs Review Labs Reviewed - No data to display  Imaging Review Dg Tibia/fibula Right  07/02/2014   CLINICAL DATA:  39 year old female with injury to the right leg while walking down a hill backwards complaining of pain in the medial aspect of the right mid calf.  EXAM: RIGHT TIBIA AND FIBULA - 2 VIEW  COMPARISON:  No priors.  FINDINGS: There is no evidence of fracture or other focal bone lesions. Soft tissues are unremarkable.  IMPRESSION: Negative.   Electronically Signed   By: Trudie Reed M.D.   On: 07/02/2014 17:33     MDM Probable soleus  muscle tear   1. Strain of calf muscle, right, initial encounter    Ace Crutches Ibuprofen Hydrocodone See Dr. Karie Schwalbe for recheck in 1 week    Elson AreasLeslie K Rona Tomson, PA-C 07/02/14 1754

## 2014-07-11 ENCOUNTER — Encounter: Payer: Self-pay | Admitting: Sports Medicine

## 2014-07-11 ENCOUNTER — Ambulatory Visit (INDEPENDENT_AMBULATORY_CARE_PROVIDER_SITE_OTHER): Payer: BLUE CROSS/BLUE SHIELD | Admitting: Sports Medicine

## 2014-07-11 VITALS — BP 116/81 | HR 93 | Ht 68.0 in | Wt 234.0 lb

## 2014-07-11 DIAGNOSIS — S86111A Strain of other muscle(s) and tendon(s) of posterior muscle group at lower leg level, right leg, initial encounter: Secondary | ICD-10-CM | POA: Diagnosis not present
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
## 2014-07-11 NOTE — Assessment & Plan Note (Signed)
Calf strain, strapped with compressive wrap. Negative thompson test. Heel lifts. OTC analgesics. Return as needed.

## 2014-08-01 ENCOUNTER — Ambulatory Visit (INDEPENDENT_AMBULATORY_CARE_PROVIDER_SITE_OTHER): Payer: BLUE CROSS/BLUE SHIELD | Admitting: Sports Medicine

## 2014-08-01 ENCOUNTER — Encounter: Payer: Self-pay | Admitting: Sports Medicine

## 2014-08-01 VITALS — BP 136/87 | HR 76 | Ht 68.0 in | Wt 234.0 lb

## 2014-08-01 DIAGNOSIS — S86111D Strain of other muscle(s) and tendon(s) of posterior muscle group at lower leg level, right leg, subsequent encounter: Secondary | ICD-10-CM | POA: Diagnosis not present

## 2014-08-01 NOTE — Progress Notes (Signed)
  Subjective:    CC: Follow-up  HPI: Alexandria Barrera returns regarding her right gastrocnemius strain, doing well, pain-free, she has a bit of swelling in the lower extremity but otherwise has returned to normal.  Past medical history, Surgical history, Family history not pertinant except as noted below, Social history, Allergies, and medications have been entered into the medical record, reviewed, and no changes needed.   Review of Systems: No fevers, chills, night sweats, weight loss, chest pain, or shortness of breath.   Objective:    General: Well Developed, well nourished, and in no acute distress.  Neuro: Alert and oriented x3, extra-ocular muscles intact, sensation grossly intact.  HEENT: Normocephalic, atraumatic, pupils equal round reactive to light, neck supple, no masses, no lymphadenopathy, thyroid nonpalpable.  Skin: Warm and dry, no rashes. Cardiac: Regular rate and rhythm, no murmurs rubs or gallops, no lower extremity edema.  Respiratory: Clear to auscultation bilaterally. Not using accessory muscles, speaking in full sentences. Right leg: No longer tender to palpation, good motion, good judgment, only trace pitting edema. The pain edema is bilateral and symmetric.  Impression and Recommendations:

## 2014-08-01 NOTE — Assessment & Plan Note (Signed)
Clinically resolved with conservative measures, there is still a bit of lower extremity swelling with mild pitting edema, this is likely sequelae of the muscle tear, and will resolve on its own. I have recommended that she continue intermittent rehabilitation exercises.

## 2014-11-28 ENCOUNTER — Encounter: Payer: Self-pay | Admitting: Sports Medicine

## 2014-11-28 ENCOUNTER — Ambulatory Visit (INDEPENDENT_AMBULATORY_CARE_PROVIDER_SITE_OTHER): Payer: BLUE CROSS/BLUE SHIELD | Admitting: Sports Medicine

## 2014-11-28 VITALS — BP 118/79 | HR 97 | Wt 231.0 lb

## 2014-11-28 DIAGNOSIS — M19071 Primary osteoarthritis, right ankle and foot: Secondary | ICD-10-CM | POA: Diagnosis not present

## 2014-11-28 DIAGNOSIS — M25571 Pain in right ankle and joints of right foot: Secondary | ICD-10-CM | POA: Insufficient documentation

## 2014-11-28 NOTE — Assessment & Plan Note (Signed)
Persistent pain for months at the talocrural joint laterally near the sinus tarsi, injection into the talocrural joint as above, at this point we are going to obtain an MRI . Return for custom orthotics and then MRI results.

## 2014-11-28 NOTE — Progress Notes (Signed)
  Subjective:    CC: Follow-up  HPI: This is a pleasant 39 year old female, I treated her in the past for a right calf strain, recently she's had increasing pain for the past several months in her anterolateral ankle, moderate, persistent, essentially negative x-rays, with failure of greater than 6 weeks of conservative measures. Pain is to the point where she desires acute interventional treatment today. No trauma.  Past medical history, Surgical history, Family history not pertinant except as noted below, Social history, Allergies, and medications have been entered into the medical record, reviewed, and no changes needed.   Review of Systems: No fevers, chills, night sweats, weight loss, chest pain, or shortness of breath.   Objective:    General: Well Developed, well nourished, and in no acute distress.  Neuro: Alert and oriented x3, extra-ocular muscles intact, sensation grossly intact.  HEENT: Normocephalic, atraumatic, pupils equal round reactive to light, neck supple, no masses, no lymphadenopathy, thyroid nonpalpable.  Skin: Warm and dry, no rashes. Cardiac: Regular rate and rhythm, no murmurs rubs or gallops, no lower extremity edema.  Respiratory: Clear to auscultation bilaterally. Not using accessory muscles, speaking in full sentences. Right Ankle: Minimal visible swelling with tenderness at the lateral talocrural joint. Range of motion is full in all directions. Strength is 5/5 in all directions. Stable lateral and medial ligaments; squeeze test and kleiger test unremarkable; Talar dome nontender; No pain at base of 5th MT; No tenderness over cuboid; No tenderness over N spot or navicular prominence No tenderness on posterior aspects of lateral and medial malleolus No sign of peroneal tendon subluxations; Negative tarsal tunnel tinel's Able to walk 4 steps.  Procedure: Real-time Ultrasound Guided Injection of right ankle Device: GE Logiq E  Verbal informed consent  obtained.  Time-out conducted.  Noted no overlying erythema, induration, or other signs of local infection.  Skin prepped in a sterile fashion.  Local anesthesia: Topical Ethyl chloride.  With sterile technique and under real time ultrasound guidance: 25-gauge needle advanced into the talocrural joint, 1 mL kenalog 40, 2 mL lidocaine injected easily.  Completed without difficulty  Pain immediately resolved suggesting accurate placement of the medication.  Advised to call if fevers/chills, erythema, induration, drainage, or persistent bleeding.  Images permanently stored and available for review in the ultrasound unit.  Impression: Technically successful ultrasound guided injection. Impression and Recommendations:

## 2014-12-01 ENCOUNTER — Ambulatory Visit (INDEPENDENT_AMBULATORY_CARE_PROVIDER_SITE_OTHER): Payer: BLUE CROSS/BLUE SHIELD

## 2014-12-01 DIAGNOSIS — M25471 Effusion, right ankle: Secondary | ICD-10-CM | POA: Diagnosis not present

## 2014-12-01 DIAGNOSIS — M7751 Other enthesopathy of right foot: Secondary | ICD-10-CM

## 2014-12-01 DIAGNOSIS — M7731 Calcaneal spur, right foot: Secondary | ICD-10-CM | POA: Diagnosis not present

## 2014-12-05 ENCOUNTER — Ambulatory Visit (INDEPENDENT_AMBULATORY_CARE_PROVIDER_SITE_OTHER): Payer: BLUE CROSS/BLUE SHIELD | Admitting: Sports Medicine

## 2014-12-05 ENCOUNTER — Encounter: Payer: Self-pay | Admitting: Sports Medicine

## 2014-12-05 VITALS — BP 132/85 | HR 85 | Wt 230.0 lb

## 2014-12-05 DIAGNOSIS — M25571 Pain in right ankle and joints of right foot: Secondary | ICD-10-CM

## 2014-12-05 DIAGNOSIS — G5751 Tarsal tunnel syndrome, right lower limb: Secondary | ICD-10-CM

## 2014-12-05 MED ORDER — AMBULATORY NON FORMULARY MEDICATION: Status: DC

## 2014-12-05 NOTE — Progress Notes (Signed)
  Subjective:    CC: Follow-up  HPI: This is a pleasant 39 year old female, she injured her ankle in June 2016, unfortunately has had persistent pain, moderate, persistent and localized over the anterolateral ankle. She responded temporarily and partially to a sinus tarsi injection a month ago, unfortunately continues to have pain so we did obtain an MRI. The MRI results are dictated below.  Past medical history, Surgical history, Family history not pertinant except as noted below, Social history, Allergies, and medications have been entered into the medical record, reviewed, and no changes needed.   Review of Systems: No fevers, chills, night sweats, weight loss, chest pain, or shortness of breath.   Objective:    General: Well Developed, well nourished, and in no acute distress.  Neuro: Alert and oriented x3, extra-ocular muscles intact, sensation grossly intact.  HEENT: Normocephalic, atraumatic, pupils equal round reactive to light, neck supple, no masses, no lymphadenopathy, thyroid nonpalpable.  Skin: Warm and dry, no rashes. Cardiac: Regular rate and rhythm, no murmurs rubs or gallops, no lower extremity edema.  Respiratory: Clear to auscultation bilaterally. Not using accessory muscles, speaking in full sentences.  Shortly cast with a walker placed  Impression and Recommendations:   I spent 25 minutes with this patient, greater than 50% was face-to-face time counseling regarding the above diagnoses, the time was separate from the time spent placing the cast.

## 2014-12-05 NOTE — Assessment & Plan Note (Signed)
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 At this point we are going to proceed with one month of cast immobilization with a rolling knee scooter for minimizing weightbearing. Return to see me in one month remove cast.

## 2015-01-02 ENCOUNTER — Ambulatory Visit: Payer: BLUE CROSS/BLUE SHIELD | Admitting: Sports Medicine

## 2015-02-01 ENCOUNTER — Emergency Department
Admission: EM | Admit: 2015-02-01 | Discharge: 2015-02-01 | Disposition: A | Discharge status: Home / Self Care | Payer: BLUE CROSS/BLUE SHIELD | Source: Home / Self Care | Attending: Family Medicine | Admitting: Family Medicine

## 2015-02-01 ENCOUNTER — Encounter: Payer: Self-pay | Admitting: Emergency Medicine

## 2015-02-01 DIAGNOSIS — S61012A Laceration without foreign body of left thumb without damage to nail, initial encounter: Secondary | ICD-10-CM

## 2015-02-01 DIAGNOSIS — S61002A Unspecified open wound of left thumb without damage to nail, initial encounter: Secondary | ICD-10-CM | POA: Diagnosis not present

## 2015-02-01 MED ORDER — TETANUS-DIPHTH-ACELL PERTUSSIS 5-2.5-18.5 LF-MCG/0.5 IM SUSP
0.5000 mL | Freq: Once | INTRAMUSCULAR | Status: AC
  Administered 2015-02-01: 0.5 mL via INTRAMUSCULAR

## 2015-02-01 NOTE — ED Notes (Signed)
Patient cut left thumb on can of food today; minimal bleeding; edges approximated; uncertain tetanus hx.

## 2015-02-01 NOTE — ED Provider Notes (Signed)
CSN: [id number]     Arrival date & time 02/01/15  1641 History   First MD Initiated Contact with Patient 02/01/15 1759     Chief Complaint  Patient presents with  . Extremity Laceration      HPI Comments: Patient lacerated her left thumb on a can of food today.  She is uncertain of her tetanus status.  Patient is a 40 y.o. female presenting with skin laceration. The history is provided by the patient and the spouse.  Laceration Location:  Finger Finger laceration location:  L thumb Length (cm):  1 Depth:  Cutaneous Quality: straight   Bleeding: controlled   Time since incident:  3 hours Laceration mechanism:  Metal edge Pain details:    Quality:  Aching   Severity:  Mild   Timing:  Constant   Progression:  Unchanged Foreign body present:  No foreign bodies Relieved by:  None tried Worsened by:  Movement Tetanus status:  Out of date   History reviewed. No pertinent past medical history. Past Surgical History  Procedure Laterality Date  . Adenoidectomy    . Wisdom tooth extraction     Family History  Problem Relation Age of Onset  . Cancer Mother   . Diabetes Father   . Hypertension Father   . Cancer Father    Social History  Substance Use Topics  . Smoking status: Former Games developermoker  . Smokeless tobacco: Never Used  . Alcohol Use: No   OB History    No data available     Review of Systems  All other systems reviewed and are negative.   Allergies  Amoxicillin; Biaxin; Erythromycin; Penicillins; Sulfa antibiotics; and Tetracyclines & related  Home Medications   Prior to Admission medications   Medication Sig Start Date End Date Taking? Authorizing Provider  AMBULATORY NON FORMULARY MEDICATION Rolling Knee Scooter, please take Rx to medical supply store. 12/05/14   Monica Bectonhomas J Thekkekandam, MD   Meds Ordered and Administered this Visit   Medications  Tdap (BOOSTRIX) injection 0.5 mL (0.5 mLs Intramuscular Given 02/01/15 1736)    BP 120/79 mmHg  Pulse 99   Temp(Src) 98.5 F (36.9 C) (Oral)  Ht 5\' 8"  (1.727 m)  Wt 230 lb (104.327 kg)  BMI 34.98 kg/m2  SpO2 99% No data found.   Physical Exam  Constitutional: She is oriented to person, place, and time. She appears well-developed and well-nourished. No distress.  Patient is obese (BMI 35.0)  HENT:  Head: Atraumatic.  Eyes: Pupils are equal, round, and reactive to light.  Pulmonary/Chest: No respiratory distress.  Musculoskeletal:       Left hand: She exhibits tenderness and laceration. She exhibits normal range of motion, no bony tenderness, normal two-point discrimination, normal capillary refill, no deformity and no swelling.       Hands: Volar surface of left thumb tip has a very superficial linear 1cm long laceration as noted on diagram.    Neurological: She is alert and oriented to person, place, and time.  Skin: Skin is warm and dry.  Nursing note and vitals reviewed.   ED Course  Procedures  Laceration Repair (Dermabond) Discussed benefits and risks of procedure and verbal consent obtained. Using sterile technique, cleansed wound with Hibiclens followed by copious lavage with normal saline.  Wound carefully inspected for debris and foreign bodies; none found.  Wound edges carefully approximated in normal anatomic position and closed with Dermabond.  Wound precautions explained to patient.     MDM   1. Laceration  of left thumb, initial encounter    Tdap administered   Keep wound clean and dry.  Return for any signs of infection (or follow-up with family doctor):  Increasing redness, swelling, pain, heat, drainage, etc.  Follow instructions on Dermabond information sheet.    Lattie Haw, MD 02/03/15 [phone]

## 2015-02-01 NOTE — Discharge Instructions (Signed)
Keep wound clean and dry.  Return for any signs of infection (or follow-up with family doctor):  Increasing redness, swelling, pain, heat, drainage, etc. °Follow instructions on Dermabond information sheet.  °

## 2016-08-18 ENCOUNTER — Ambulatory Visit (INDEPENDENT_AMBULATORY_CARE_PROVIDER_SITE_OTHER): Payer: BLUE CROSS/BLUE SHIELD

## 2016-08-18 ENCOUNTER — Ambulatory Visit (INDEPENDENT_AMBULATORY_CARE_PROVIDER_SITE_OTHER): Payer: BLUE CROSS/BLUE SHIELD | Admitting: Sports Medicine

## 2016-08-18 ENCOUNTER — Encounter: Payer: Self-pay | Admitting: Sports Medicine

## 2016-08-18 VITALS — BP 131/85 | HR 109 | Wt 231.0 lb

## 2016-08-18 DIAGNOSIS — M25552 Pain in left hip: Secondary | ICD-10-CM | POA: Diagnosis not present

## 2016-08-18 DIAGNOSIS — Z3200 Encounter for pregnancy test, result unknown: Secondary | ICD-10-CM | POA: Diagnosis not present

## 2016-08-18 DIAGNOSIS — S52502A Unspecified fracture of the lower end of left radius, initial encounter for closed fracture: Secondary | ICD-10-CM

## 2016-08-18 LAB — POCT URINE PREGNANCY: Preg Test, Ur: NEGATIVE

## 2016-08-18 MED ORDER — HYDROCODONE-ACETAMINOPHEN 5-325 MG PO TABS: 1.0000 | ORAL_TABLET | Freq: Three times a day (TID) | ORAL | 0 refills | Status: AC | PRN
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
## 2016-08-18 NOTE — Progress Notes (Signed)
   Subjective:    I'm seeing this patient as a consultation for:  Alexandria CordsAllison Brewer, NP  CC: Fall from horse  HPI:  Burgess EstelleYesterday this pleasant 41 year old female fell off a horse onto an outstretched left hand as well as her left hip and buttock. She had immediate pain, swelling, bruising on her left wrist, her left hip pain has improved steadily. Pain is moderate, persistent.  Past medical history, Surgical history, Family history not pertinant except as noted below, Social history, Allergies, and medications have been entered into the medical record, reviewed, and no changes needed.   Review of Systems: No headache, visual changes, nausea, vomiting, diarrhea, constipation, dizziness, abdominal pain, skin rash, fevers, chills, night sweats, weight loss, swollen lymph nodes, body aches, joint swelling, muscle aches, chest pain, shortness of breath, mood changes, visual or auditory hallucinations.   Objective:   General: Well Developed, well nourished, and in no acute distress.  Neuro:  Extra-ocular muscles intact, able to move all 4 extremities, sensation grossly intact.  Deep tendon reflexes tested were normal. Psych: Alert and oriented, mood congruent with affect. ENT:  Ears and nose appear unremarkable.  Hearing grossly normal. Neck: Unremarkable overall appearance, trachea midline.  No visible thyroid enlargement. Eyes: Conjunctivae and lids appear unremarkable.  Pupils equal and round. Skin: Warm and dry, no rashes noted.  Cardiovascular: Pulses palpable, no extremity edema. Left wrist: Tender to palpation, swollen, bruised, over the dorsal distal radius. Neurovascularly intact distally  Left Hip: ROM IR: 60 Deg, ER: 60 Deg, Flexion: 120 Deg, Extension: 100 Deg, Abduction: 45 Deg, Adduction: 45 Deg Strength IR: 5/5, ER: 5/5, Flexion: 5/5, Extension: 5/5, Abduction: 5/5, Adduction: 5/5 Pelvic alignment unremarkable to inspection and palpation. Standing hip rotation and gait without  trendelenburg / unsteadiness. Greater trochanter without tenderness to palpation. No tenderness over piriformis. No SI joint tenderness and normal minimal SI movement.  X-rays reviewed and show a dorsally impacted intra-articular fracture of the distal radius. There is significant loss of the radial height and inclination. Volar tilt has also been lost.  Sugar tong splint placed.  Urine pregnancy test is negative.  Impression and Recommendations:   This case required medical decision making of moderate complexity.  Fall from horse Occurred yesterday with likely contusions on the left hip and buttock as well as a questionable broken wrist. X-rays obtained.  Displaced, angulated distal radius fracture, this will need ORIF.  Sugar tong splint, hydrocodone for pain, referral to orthopedic surgery tomorrow.

## 2016-09-01 DIAGNOSIS — S52502A Unspecified fracture of the lower end of left radius, initial encounter for closed fracture: Secondary | ICD-10-CM | POA: Insufficient documentation

## 2019-05-12 IMAGING — DX DG WRIST COMPLETE 3+V*L*
4 series · 4 of 4 positions shown · non-contrast
Comparison: No recent prior.

CLINICAL DATA: Wrist injury from falling off horse. Swelling and
pain.

EXAM:
LEFT WRIST - COMPLETE 3+ VIEW

[wrist pa]
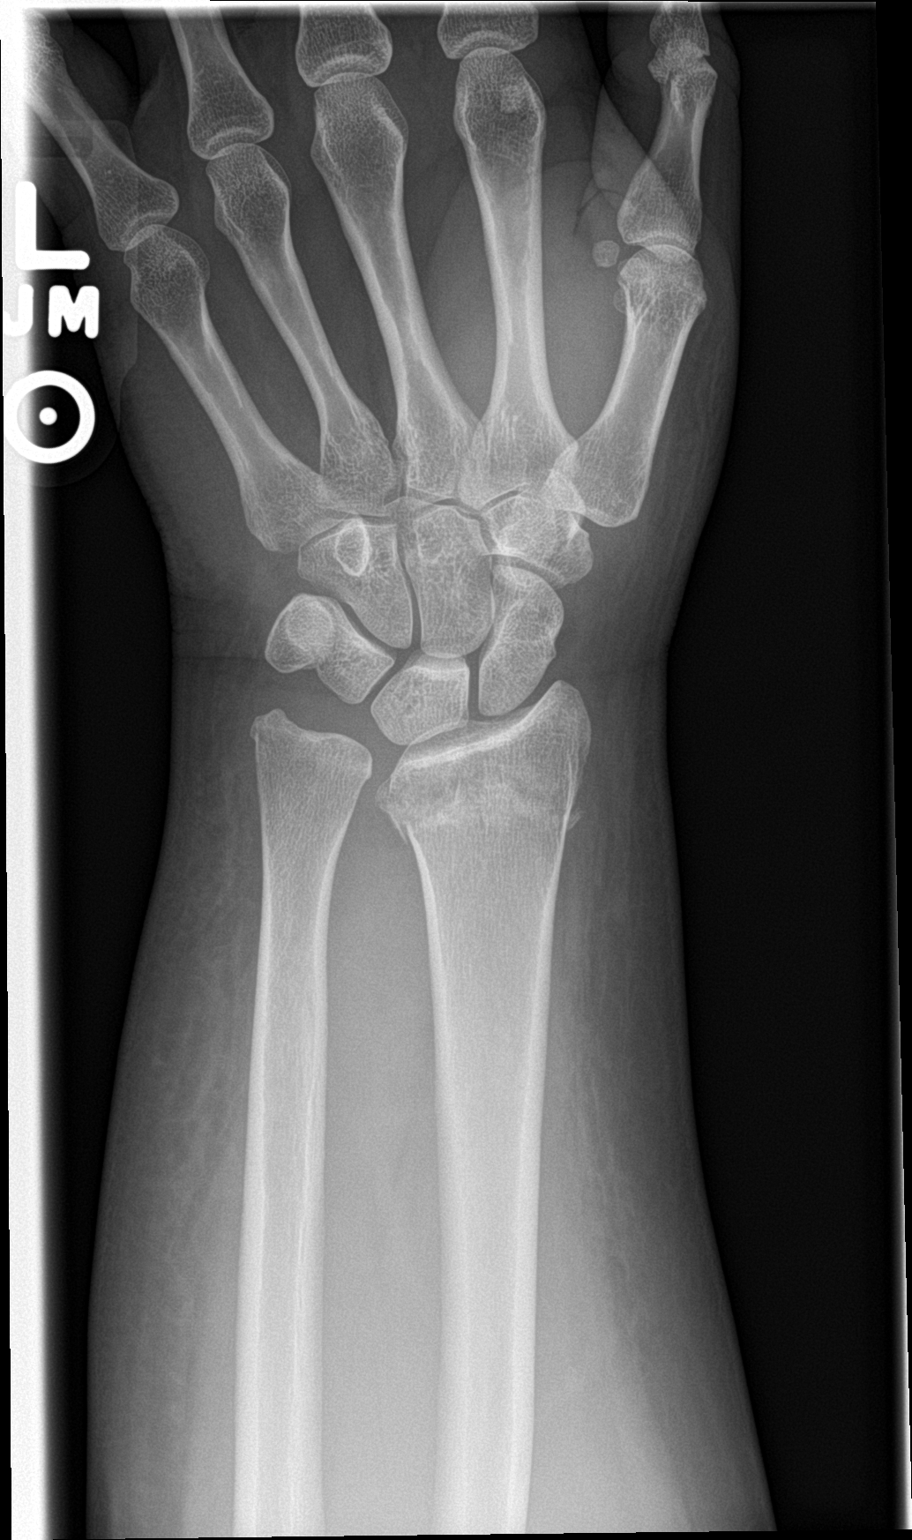

[wrist obl]
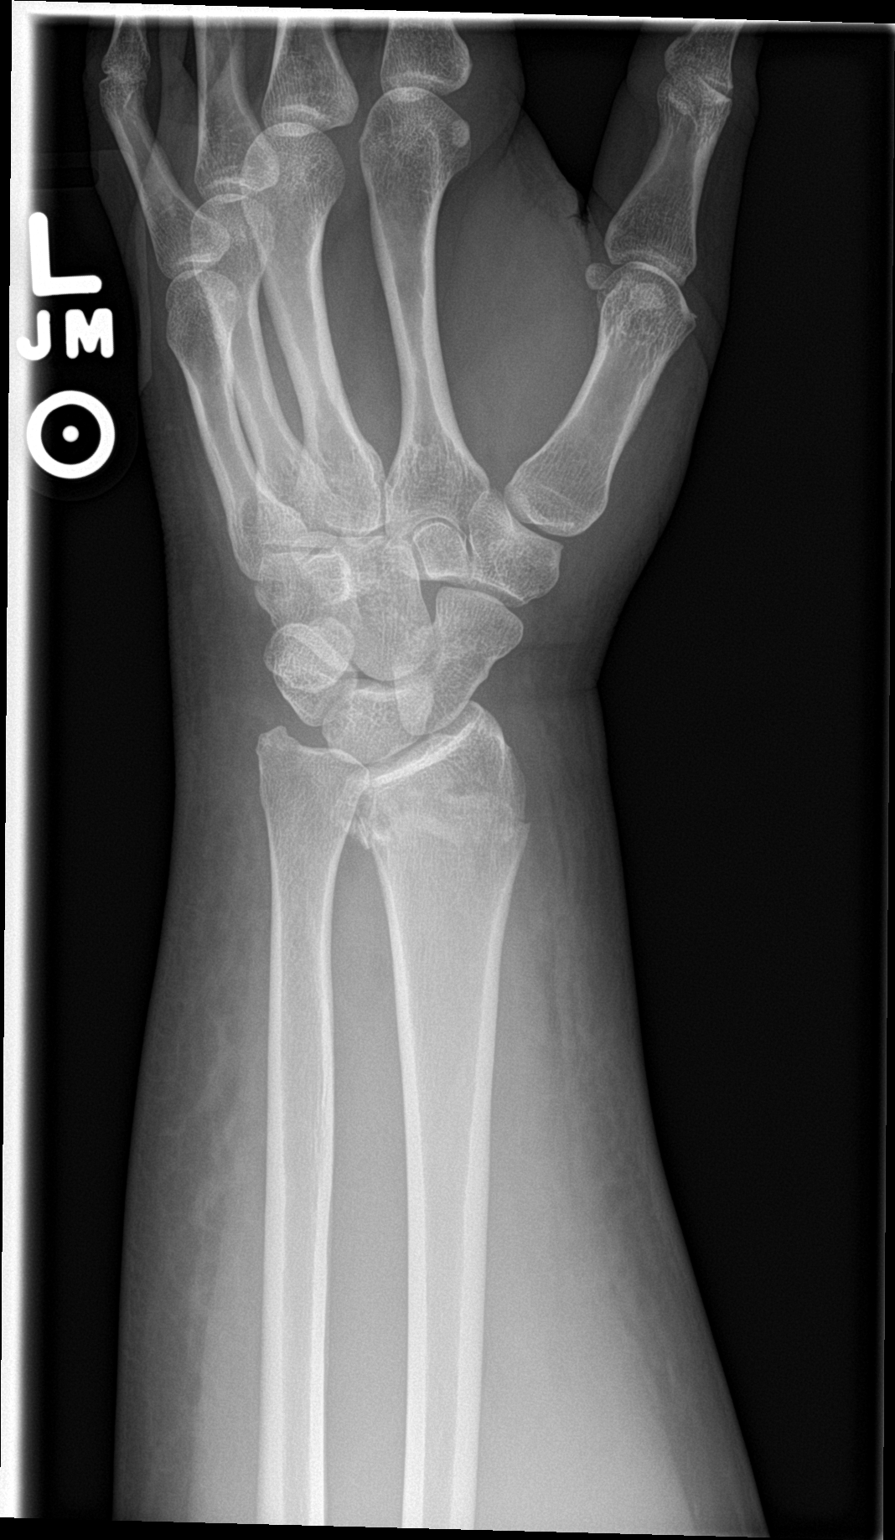

[wrist lat]
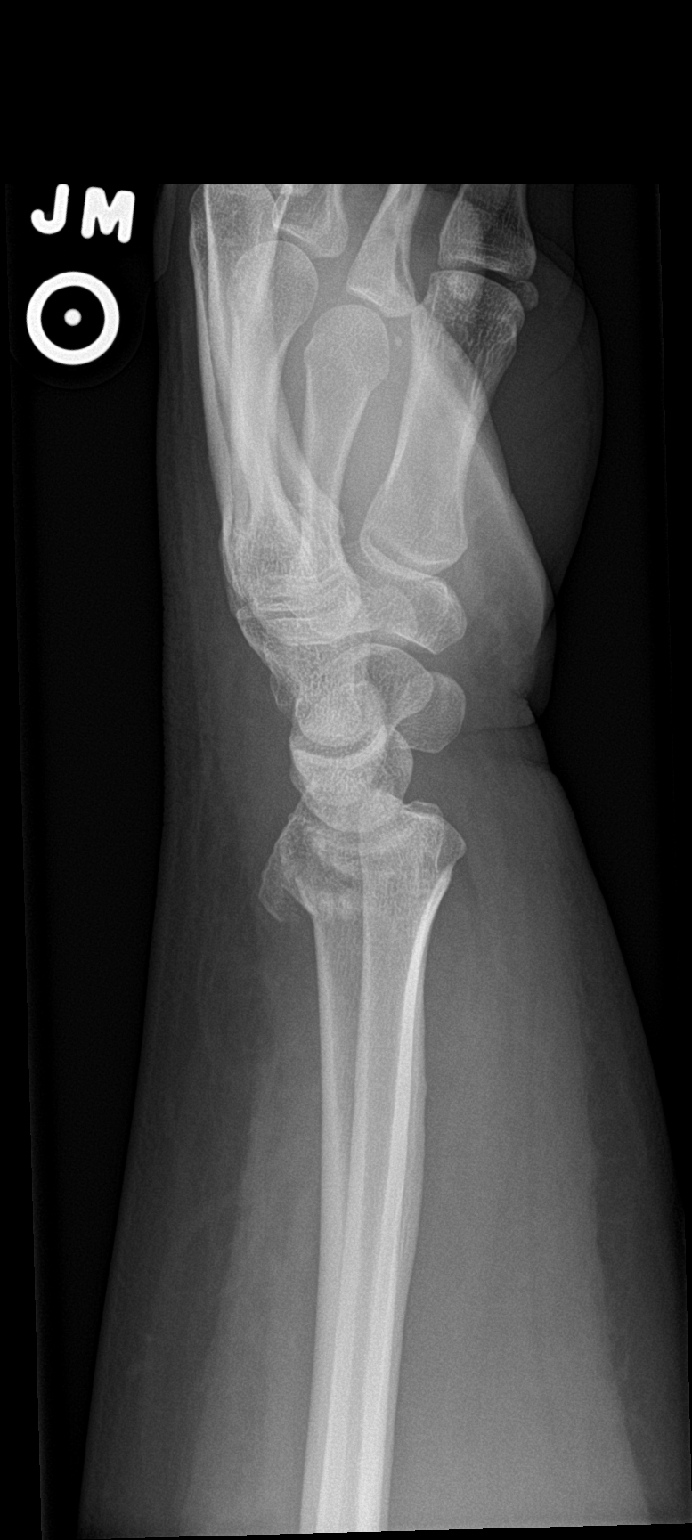

[wrist navicular]
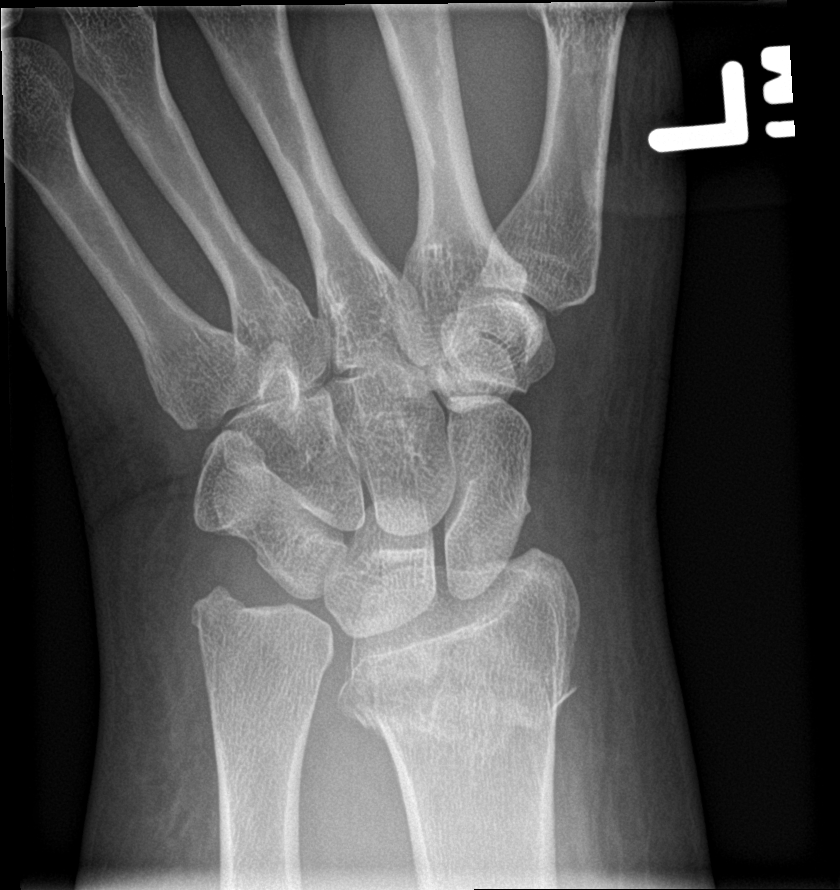

[4 of 4 positions shown; findings below may reference images not displayed]

FINDINGS: Comminuted angulated displaced fracture of the distal radial
metaphysis is noted. Distal ulna is intact. No other focal
abnormality identified.
IMPRESSION: Comminuted angulated displaced fracture of the distal radius noted.
